# Patient Record
Sex: Male | Born: 1965 | Race: White | Hispanic: No | Marital: Married | Smoking: Former smoker
Health system: Southern US, Community
[De-identification: ages and names within clinical notes are randomized; demographics above are authoritative.]

## PROBLEM LIST (undated history)

## (undated) HISTORY — PX: CHOLECYSTECTOMY: SHX55

## (undated) HISTORY — PX: SHOULDER SURGERY: SHX246

---

## 2001-03-09 ENCOUNTER — Emergency Department (HOSPITAL_COMMUNITY): Admission: EM | Admit: 2001-03-09 | Discharge: 2001-03-09 | Payer: Self-pay

## 2001-11-16 ENCOUNTER — Emergency Department (HOSPITAL_COMMUNITY): Admission: EM | Admit: 2001-11-16 | Discharge: 2001-11-16 | Payer: Self-pay | Admitting: Emergency Medicine

## 2006-06-17 ENCOUNTER — Emergency Department (HOSPITAL_COMMUNITY): Admission: EM | Admit: 2006-06-17 | Discharge: 2006-06-17 | Payer: Self-pay | Admitting: Emergency Medicine

## 2007-03-27 ENCOUNTER — Emergency Department (HOSPITAL_COMMUNITY): Admission: EM | Admit: 2007-03-27 | Discharge: 2007-03-27 | Payer: Self-pay | Admitting: Emergency Medicine

## 2008-05-17 ENCOUNTER — Emergency Department (HOSPITAL_COMMUNITY): Admission: EM | Admit: 2008-05-17 | Discharge: 2008-05-17 | Payer: Self-pay | Admitting: Emergency Medicine

## 2009-11-10 ENCOUNTER — Inpatient Hospital Stay (HOSPITAL_COMMUNITY): Admission: EM | Admit: 2009-11-10 | Discharge: 2009-11-12 | Payer: Self-pay | Admitting: Emergency Medicine

## 2009-11-10 ENCOUNTER — Encounter (INDEPENDENT_AMBULATORY_CARE_PROVIDER_SITE_OTHER): Payer: Self-pay

## 2011-01-03 LAB — DIFFERENTIAL
Basophils Absolute: 0 10*3/uL (ref 0.0–0.1)
Basophils Relative: 0 % (ref 0–1)
Lymphocytes Relative: 16 % (ref 12–46)
Monocytes Absolute: 0.8 10*3/uL (ref 0.1–1.0)
Monocytes Relative: 5 % (ref 3–12)
Neutro Abs: 13.3 10*3/uL — ABNORMAL HIGH (ref 1.7–7.7)

## 2011-01-03 LAB — BASIC METABOLIC PANEL
BUN: 7 mg/dL (ref 6–23)
BUN: 8 mg/dL (ref 6–23)
CO2: 30 mEq/L (ref 19–32)
Calcium: 8.6 mg/dL (ref 8.4–10.5)
Chloride: 109 mEq/L (ref 96–112)
Creatinine, Ser: 1.13 mg/dL (ref 0.4–1.5)
GFR calc Af Amer: >60 mL/min (ref >60)
GFR calc Af Amer: >60 mL/min (ref >60)
GFR calc non Af Amer: >60 mL/min (ref >60)
Glucose, Bld: 100 mg/dL — ABNORMAL HIGH (ref 70–99)
Glucose, Bld: 110 mg/dL — ABNORMAL HIGH (ref 70–99)
Potassium: 3.8 mEq/L (ref 3.5–5.1)
Potassium: 3.8 mEq/L (ref 3.5–5.1)
Sodium: 140 mEq/L (ref 135–145)
Sodium: 144 mEq/L (ref 135–145)

## 2011-01-03 LAB — COMPREHENSIVE METABOLIC PANEL
AST: 41 U/L — ABNORMAL HIGH (ref 0–37)
Albumin: 4.3 g/dL (ref 3.5–5.2)
BUN: 13 mg/dL (ref 6–23)
Calcium: 9.1 mg/dL (ref 8.4–10.5)
Creatinine, Ser: 1.09 mg/dL (ref 0.4–1.5)
Total Bilirubin: 0.5 mg/dL (ref 0.3–1.2)
Total Protein: 7.1 g/dL (ref 6.0–8.3)

## 2011-01-03 LAB — CBC
HCT: 36.9 % — ABNORMAL LOW (ref 39.0–52.0)
HCT: 38.5 % — ABNORMAL LOW (ref 39.0–52.0)
Hemoglobin: 12.7 g/dL — ABNORMAL LOW (ref 13.0–17.0)
MCHC: 33.9 g/dL (ref 30.0–36.0)
MCHC: 34.1 g/dL (ref 30.0–36.0)
MCHC: 34.4 g/dL (ref 30.0–36.0)
MCHC: 34.9 g/dL (ref 30.0–36.0)
MCV: 92.2 fL (ref 78.0–100.0)
MCV: 92.3 fL (ref 78.0–100.0)
MCV: 92.9 fL (ref 78.0–100.0)
Platelets: 221 10*3/uL (ref 150–400)
Platelets: 250 10*3/uL (ref 150–400)
RBC: 3.97 MIL/uL — ABNORMAL LOW (ref 4.22–5.81)
RBC: 4.17 MIL/uL — ABNORMAL LOW (ref 4.22–5.81)
RBC: 4.25 MIL/uL (ref 4.22–5.81)
RBC: 4.67 MIL/uL (ref 4.22–5.81)
RDW: 13.1 % (ref 11.5–15.5)
WBC: 13.6 10*3/uL — ABNORMAL HIGH (ref 4.0–10.5)
WBC: 14 10*3/uL — ABNORMAL HIGH (ref 4.0–10.5)
WBC: 8.8 10*3/uL (ref 4.0–10.5)

## 2011-01-03 LAB — URINALYSIS, ROUTINE W REFLEX MICROSCOPIC
Glucose, UA: NEGATIVE mg/dL
Nitrite: NEGATIVE
Protein, ur: NEGATIVE mg/dL
pH: 6.5 (ref 5.0–8.0)

## 2011-01-03 LAB — GLUCOSE, CAPILLARY: Glucose-Capillary: 115 mg/dL — ABNORMAL HIGH (ref 70–99)

## 2011-01-03 LAB — URINE MICROSCOPIC-ADD ON

## 2011-01-03 LAB — LIPASE, BLOOD: Lipase: 26 U/L (ref 11–59)

## 2011-08-05 LAB — I-STAT 8, (EC8 V) (CONVERTED LAB)
Acid-Base Excess: 1
Chloride: 106
Glucose, Bld: 93
Potassium: 3.7
pCO2, Ven: 43.7 — ABNORMAL LOW
pH, Ven: 7.384 — ABNORMAL HIGH

## 2011-08-05 LAB — POCT I-STAT CREATININE: Creatinine, Ser: 1

## 2012-03-04 ENCOUNTER — Emergency Department (HOSPITAL_COMMUNITY)
Admission: EM | Admit: 2012-03-04 | Discharge: 2012-03-04 | Disposition: A | Discharge status: Home / Self Care | Payer: BC Managed Care – PPO | Attending: Emergency Medicine | Admitting: Emergency Medicine

## 2012-03-04 ENCOUNTER — Encounter (HOSPITAL_COMMUNITY): Payer: Self-pay

## 2012-03-04 DIAGNOSIS — IMO0002 Reserved for concepts with insufficient information to code with codable children: Secondary | ICD-10-CM | POA: Insufficient documentation

## 2012-03-04 DIAGNOSIS — M545 Low back pain, unspecified: Secondary | ICD-10-CM | POA: Insufficient documentation

## 2012-03-04 DIAGNOSIS — R209 Unspecified disturbances of skin sensation: Secondary | ICD-10-CM | POA: Insufficient documentation

## 2012-03-04 DIAGNOSIS — M5416 Radiculopathy, lumbar region: Secondary | ICD-10-CM

## 2012-03-04 DIAGNOSIS — X500XXA Overexertion from strenuous movement or load, initial encounter: Secondary | ICD-10-CM | POA: Insufficient documentation

## 2012-03-04 MED ORDER — HYDROMORPHONE HCL PF 1 MG/ML IJ SOLN
1.0000 mg | Freq: Once | INTRAMUSCULAR | Status: AC
Start: 2012-03-04 — End: 2012-03-04
  Administered 2012-03-04: 1 mg via INTRAVENOUS
  Filled 2012-03-04: qty 1

## 2012-03-04 MED ORDER — HYDROCODONE-ACETAMINOPHEN 5-325 MG PO TABS
1.0000 | ORAL_TABLET | Freq: Once | ORAL | Status: AC
  Administered 2012-03-04: 1 via ORAL
  Filled 2012-03-04: qty 1

## 2012-03-04 MED ORDER — HYDROCODONE-ACETAMINOPHEN 5-325 MG PO TABS: 2.0000 | ORAL_TABLET | ORAL | Status: AC | PRN

## 2012-03-04 NOTE — ED Notes (Signed)
ZOX:[id number]<AV> Expected date:<BR> Expected time: 1:58 PM<BR> Means of arrival:Ambulance<BR> Comments:<BR> M10 -- Back Pain

## 2012-03-04 NOTE — ED Notes (Signed)
Per EMS pt completed moving a rental carpet cleaner and felt sudden pain in lower back with numbness and tingling. Pain radiates abd to chest.

## 2012-03-04 NOTE — ED Notes (Signed)
Pt able to tolerate standing. WC  To car.

## 2012-03-04 NOTE — ED Notes (Signed)
Fentanyl given in route
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
## 2012-03-04 NOTE — Discharge Instructions (Signed)
Lumbosacral Radiculopathy Lumbosacral radiculopathy is a pinched nerve or nerves in the low back (lumbosacral area). When this happens you may have weakness in your legs and may not be able to stand on your toes. You may have pain going down into your legs. There may be difficulties with walking normally. There are many causes of this problem. Sometimes this may happen from an injury, or simply from arthritis or boney problems. It may also be caused by other illnesses such as diabetes. If there is no improvement after treatment, further studies may be done to find the exact cause. DIAGNOSIS  X-rays may be needed if the problems become long standing. Electromyograms may be done. This study is one in which the working of nerves and muscles is studied. HOME CARE INSTRUCTIONS   Applications of ice packs may be helpful. Ice can be used in a plastic bag with a towel around it to prevent frostbite to skin. This may be used every 2 hours for 20 to 30 minutes, or as needed, while awake, or as directed by your caregiver.   Only take over-the-counter or prescription medicines for pain, discomfort, or fever as directed by your caregiver.   If physical therapy was prescribed, follow your caregiver's directions.  SEEK IMMEDIATE MEDICAL CARE IF:   You have pain not controlled with medications.   You seem to be getting worse rather than better.   You develop increasing weakness in your legs.   You develop loss of bowel or bladder control.   You have difficulty with walking or balance, or develop clumsiness in the use of your legs.   You have a fever.  MAKE SURE YOU:   Understand these instructions.   Will watch your condition.   Will get help right away if you are not doing well or get worse.  Document Released: 10/04/2005 Document Revised: 09/23/2011 Document Reviewed: 05/24/2008 [facility] Patient Information 2012 [place], [place].   Take ibuprofen or Tylenol for mild pain or the pain medicine  prescribed for bad pain. Contact Dr. Vear Clock if having significant pain in 4 or 5 days

## 2012-03-07 ENCOUNTER — Other Ambulatory Visit (HOSPITAL_COMMUNITY): Payer: Self-pay

## 2012-03-07 DIAGNOSIS — M79604 Pain in right leg: Secondary | ICD-10-CM

## 2012-03-07 DIAGNOSIS — M545 Low back pain, unspecified: Secondary | ICD-10-CM

## 2012-03-10 ENCOUNTER — Ambulatory Visit (HOSPITAL_COMMUNITY)
Admission: RE | Admit: 2012-03-10 | Discharge: 2012-03-10 | Disposition: A | Discharge status: Home / Self Care | Payer: BC Managed Care – PPO | Source: Ambulatory Visit

## 2012-03-10 DIAGNOSIS — M545 Low back pain, unspecified: Secondary | ICD-10-CM

## 2012-03-10 DIAGNOSIS — M5126 Other intervertebral disc displacement, lumbar region: Secondary | ICD-10-CM | POA: Insufficient documentation

## 2012-03-10 DIAGNOSIS — M79604 Pain in right leg: Secondary | ICD-10-CM

## 2012-05-08 ENCOUNTER — Encounter: Payer: Self-pay | Admitting: Neurological Surgery

## 2012-05-18 ENCOUNTER — Encounter: Payer: Self-pay | Admitting: Neurological Surgery

## 2012-06-18 ENCOUNTER — Encounter: Payer: Self-pay | Admitting: Neurological Surgery

## 2013-01-09 ENCOUNTER — Emergency Department: Payer: Self-pay | Admitting: Emergency Medicine

## 2015-04-29 ENCOUNTER — Encounter: Payer: Self-pay | Admitting: Urgent Care

## 2015-04-29 ENCOUNTER — Emergency Department: Payer: BLUE CROSS/BLUE SHIELD

## 2015-04-29 ENCOUNTER — Emergency Department
Admission: EM | Admit: 2015-04-29 | Discharge: 2015-04-29 | Disposition: A | Discharge status: Home / Self Care | Payer: BLUE CROSS/BLUE SHIELD | Attending: Emergency Medicine | Admitting: Emergency Medicine

## 2015-04-29 DIAGNOSIS — R1032 Left lower quadrant pain: Secondary | ICD-10-CM | POA: Diagnosis present

## 2015-04-29 DIAGNOSIS — Z79899 Other long term (current) drug therapy: Secondary | ICD-10-CM | POA: Diagnosis not present

## 2015-04-29 DIAGNOSIS — R197 Diarrhea, unspecified: Secondary | ICD-10-CM | POA: Diagnosis not present

## 2015-04-29 DIAGNOSIS — Z87891 Personal history of nicotine dependence: Secondary | ICD-10-CM | POA: Diagnosis not present

## 2015-04-29 DIAGNOSIS — N281 Cyst of kidney, acquired: Secondary | ICD-10-CM | POA: Diagnosis not present

## 2015-04-29 LAB — URINALYSIS COMPLETE WITH MICROSCOPIC (ARMC ONLY)
BACTERIA UA: NONE SEEN
BILIRUBIN URINE: NEGATIVE
Glucose, UA: NEGATIVE mg/dL
Hgb urine dipstick: NEGATIVE
Ketones, ur: NEGATIVE mg/dL
LEUKOCYTES UA: NEGATIVE
Nitrite: NEGATIVE
PH: 5 (ref 5.0–8.0)
Protein, ur: NEGATIVE mg/dL
SQUAMOUS EPITHELIAL / LPF: NONE SEEN
Specific Gravity, Urine: 1.026 (ref 1.005–1.030)

## 2015-04-29 LAB — COMPREHENSIVE METABOLIC PANEL
ALK PHOS: 64 U/L (ref 38–126)
ALT: 44 U/L (ref 17–63)
AST: 35 U/L (ref 15–41)
Albumin: 4.2 g/dL (ref 3.5–5.0)
Anion gap: 6 (ref 5–15)
BUN: 14 mg/dL (ref 6–20)
CO2: 26 mmol/L (ref 22–32)
CREATININE: 1.11 mg/dL (ref 0.61–1.24)
Calcium: 9.1 mg/dL (ref 8.9–10.3)
Chloride: 109 mmol/L (ref 101–111)
GFR calc Af Amer: >60 mL/min (ref >60)
GFR calc non Af Amer: >60 mL/min (ref >60)
Glucose, Bld: 122 mg/dL — ABNORMAL HIGH (ref 65–99)
Potassium: 3.8 mmol/L (ref 3.5–5.1)
Sodium: 141 mmol/L (ref 135–145)
TOTAL PROTEIN: 6.8 g/dL (ref 6.5–8.1)
Total Bilirubin: 0.5 mg/dL (ref 0.3–1.2)

## 2015-04-29 LAB — CBC
HEMATOCRIT: 44.1 % (ref 40.0–52.0)
Hemoglobin: 14.6 g/dL (ref 13.0–18.0)
MCH: 30.1 pg (ref 26.0–34.0)
MCHC: 33.1 g/dL (ref 32.0–36.0)
MCV: 91 fL (ref 80.0–100.0)
PLATELETS: 266 10*3/uL (ref 150–440)
RBC: 4.84 MIL/uL (ref 4.40–5.90)
RDW: 13.1 % (ref 11.5–14.5)
WBC: 9.4 10*3/uL (ref 3.8–10.6)

## 2015-04-29 LAB — LIPASE, BLOOD: Lipase: 56 U/L — ABNORMAL HIGH (ref 22–51)

## 2015-04-29 MED ORDER — ONDANSETRON HCL 4 MG/2ML IJ SOLN
4.0000 mg | INTRAMUSCULAR | Status: AC
  Administered 2015-04-29: 4 mg via INTRAVENOUS
  Filled 2015-04-29: qty 2

## 2015-04-29 MED ORDER — IOHEXOL 240 MG/ML SOLN
25.0000 mL | Freq: Once | INTRAMUSCULAR | Status: AC | PRN
  Administered 2015-04-29: 25 mL via ORAL

## 2015-04-29 MED ORDER — CIPROFLOXACIN HCL 500 MG PO TABS: 500.0000 mg | ORAL_TABLET | Freq: Two times a day (BID) | ORAL | Status: AC

## 2015-04-29 MED ORDER — MORPHINE SULFATE 4 MG/ML IJ SOLN
4.0000 mg | Freq: Once | INTRAMUSCULAR | Status: AC
  Administered 2015-04-29: 4 mg via INTRAVENOUS
  Filled 2015-04-29: qty 1

## 2015-04-29 MED ORDER — SODIUM CHLORIDE 0.9 % IV BOLUS (SEPSIS)
500.0000 mL | INTRAVENOUS | Status: AC
Start: 2015-04-29 — End: 2015-04-29
  Administered 2015-04-29: 500 mL via INTRAVENOUS

## 2015-04-29 MED ORDER — IOHEXOL 350 MG/ML SOLN
100.0000 mL | Freq: Once | INTRAVENOUS | Status: AC | PRN
  Administered 2015-04-29: 100 mL via INTRAVENOUS

## 2015-04-29 MED ORDER — METRONIDAZOLE 500 MG PO TABS: 500.0000 mg | ORAL_TABLET | Freq: Three times a day (TID) | ORAL | Status: DC

## 2015-04-29 NOTE — ED Notes (Signed)
Patient transported to CT 

## 2015-04-29 NOTE — ED Notes (Signed)
Pt with LLQ since Sunday.  States diarrhea yesterday morning 4 times.  States pain intermittently radiates to RLQ.  Reports passing gas.  Denies n/v.  Pt is A&Ox4, speaking in complete and coherent sentences and in NAD at this time.

## 2015-04-29 NOTE — ED Notes (Signed)
Pt oob to br. 

## 2015-04-29 NOTE — ED Provider Notes (Signed)
CT scan was reviewed by me.   IMPRESSION: Inflammation surrounds the mid descending colon. Appearance raises possibility of epiploic appendagitis. Small focal area of diverticulitis not entirely excluded in the proper clinical setting. Otherwise, no evidence of extra luminal bowel inflammatory process, free fluid or free air.  Fatty infiltration of the liver.  Within the left kidney there are low-density structures some of which are cysts whereas others cannot be confirmed as simple cysts. Largest located lower pole region measures up to 3.2 cm and may have a small calcification along its periphery. Renal sonogram can be obtained for further delineation.  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 No aneurysmal dilation or high-grade stenosis although there are areas of narrowing.   Given the above findings, patient be discharged with Cipro and Flagyl. He'll be advised to follow-up with his doctor. His also advised of the renal cyst and need follow-up for renal ultrasound.  Dalton FilbertJonathan E Raiana Pharris, MD 04/29/15 [phone]
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
## 2015-04-29 NOTE — ED Notes (Signed)
Patient presents with c/o LLQ abd pain since Sunday. (+) diarrhea. Denies N/V/F and urinary symptoms.

## 2015-04-29 NOTE — ED Notes (Signed)
Pt finished with contrast - ct notified

## 2015-04-29 NOTE — Discharge Instructions (Signed)

## 2016-06-09 ENCOUNTER — Ambulatory Visit: Payer: Self-pay | Admitting: Physician Assistant

## 2016-06-09 ENCOUNTER — Encounter: Payer: Self-pay | Admitting: Physician Assistant

## 2016-06-09 VITALS — BP 130/80 | HR 80 | Temp 98.6°F

## 2016-06-09 DIAGNOSIS — J340 Abscess, furuncle and carbuncle of nose: Secondary | ICD-10-CM

## 2016-06-09 MED ORDER — CLINDAMYCIN HCL 300 MG PO CAPS: 300.0000 mg | ORAL_CAPSULE | Freq: Three times a day (TID) | ORAL | 0 refills | Status: DC

## 2016-06-09 MED ORDER — MUPIROCIN 2 % EX OINT: 1.0000 "application " | TOPICAL_OINTMENT | Freq: Two times a day (BID) | CUTANEOUS | 1 refills | Status: DC

## 2016-06-09 NOTE — Progress Notes (Signed)
S: c/o sore on side of nose, had same thing on inside of nose 2 weeks ago, got a little better but now has progressed, states lesion on outside of his nose had a pimple on it that popped during sleep, denies fever/chills  O: vitals wnl, nad, perrl eomi, nose with pea sized lesion on r nostril, area inside of nares is swollen and red, no exudate noted, neck supple no lymph, lungs c t a, cv rrr  A: abscess  P: clindamycin, bactroban ointment

## 2016-12-21 IMAGING — CT CT ABD-PELV W/ CM
1 of 3 series · 13 of 32 positions shown, 18 images · IV contrast (omnipaque)
Comparison: No comparison CT abdomen and pelvis. Prior abdominal
ultrasound 11/10/2009.

CLINICAL DATA: 48-year-old male with 2 day history of gradual onset
of left lower quadrant abdominal pain with multiple episodes of
diarrhea. Post cholecystectomy. Initial encounter.

EXAM:
CT ABDOMEN AND PELVIS WITH CONTRAST
TECHNIQUE: Multidetector CT imaging of the abdomen and pelvis was performed
using the standard protocol following bolus administration of
intravenous contrast.
CONTRAST:  100mL OMNIPAQUE IOHEXOL 350 MG/ML SOLN

[Series 2: routine abd pel with · axial · 0.68mm/px · z∈[-1020,-590]mm · 13 of 98 slices shown, 18 images]
[im 6/98  soft-tissue]
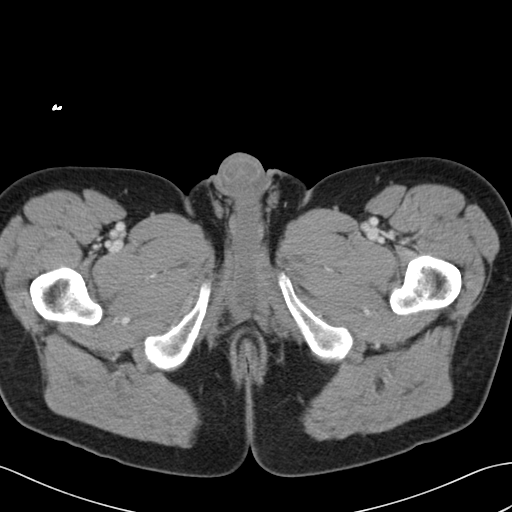
[im 6/98  bone]
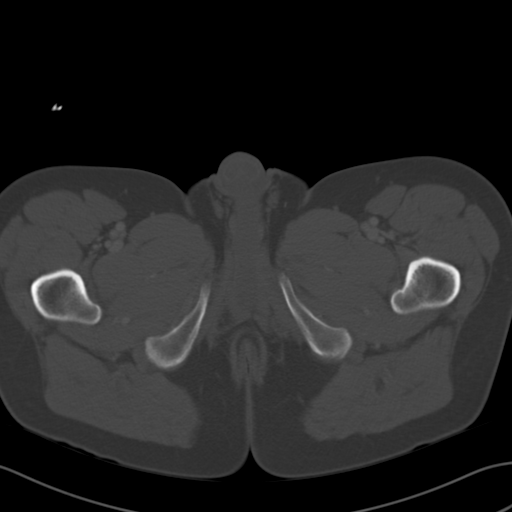
[im 16/98  soft-tissue]
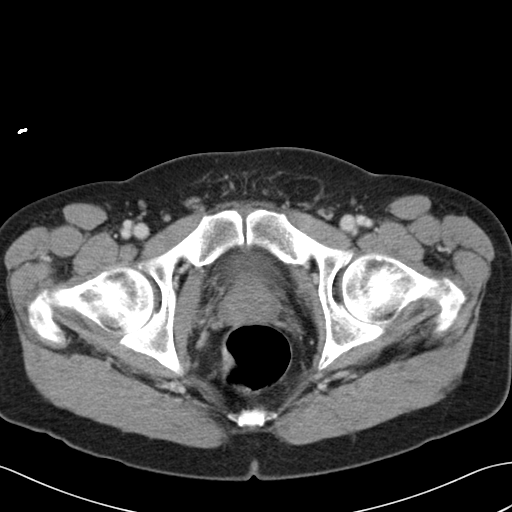
[im 21/98  soft-tissue]
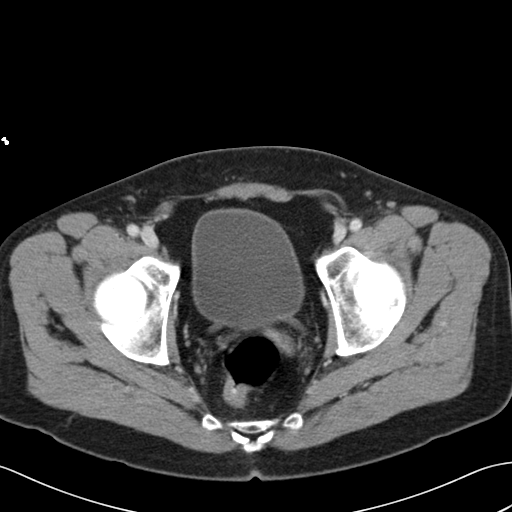
[im 31/98  soft-tissue]
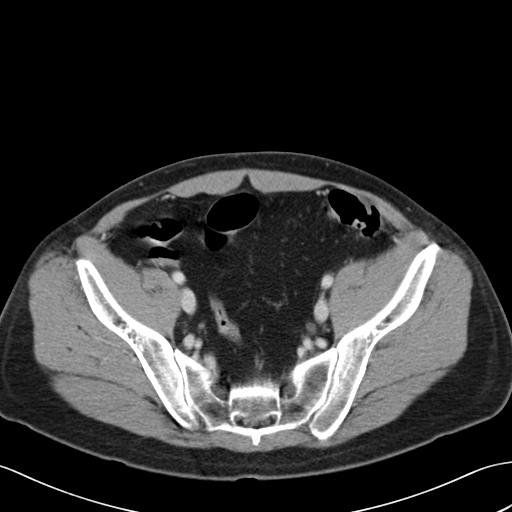
[im 36/98  soft-tissue]
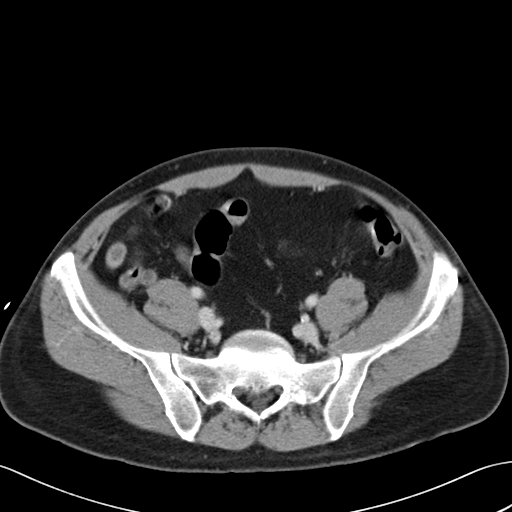
[im 46/98  soft-tissue]
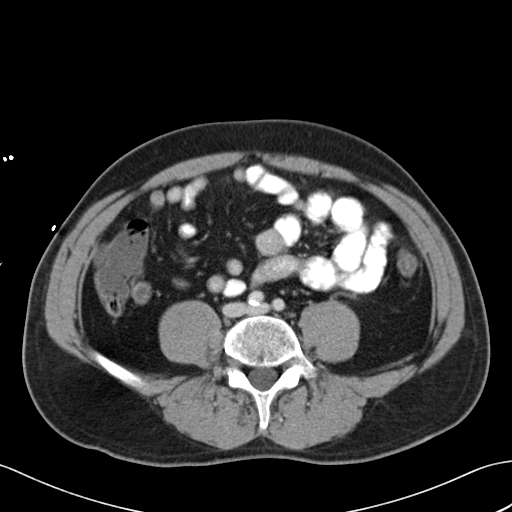
[im 52/98  soft-tissue]
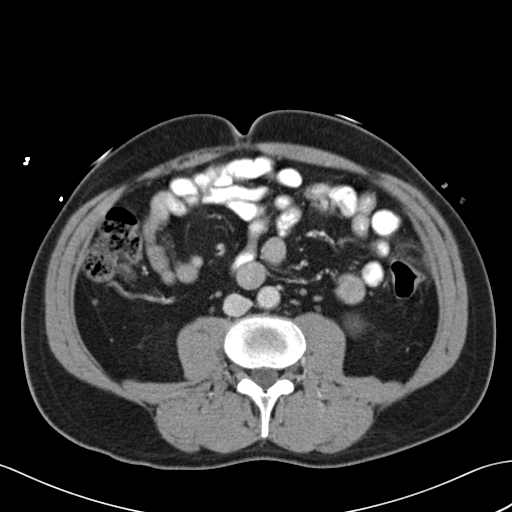
[im 62/98  soft-tissue]
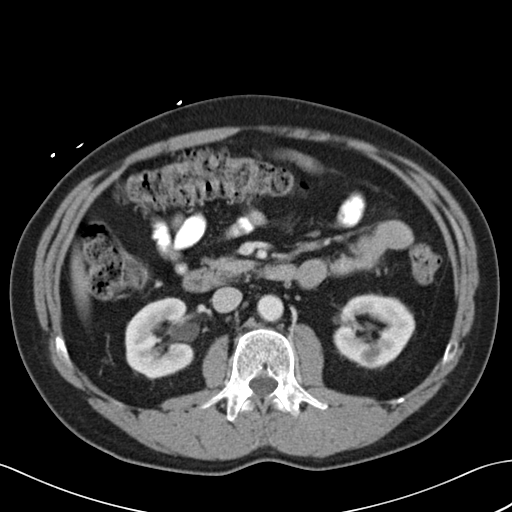
[im 67/98  soft-tissue]
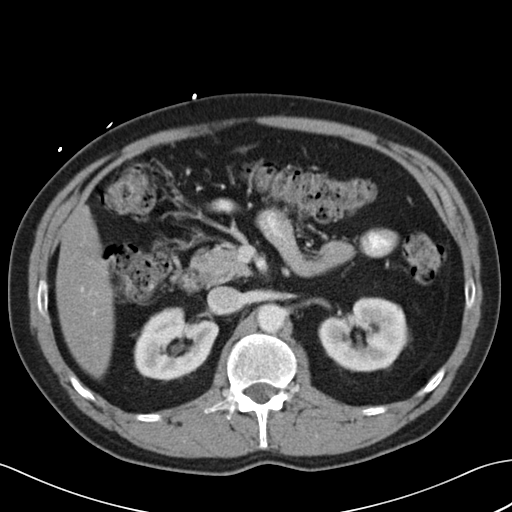
[im 67/98  bone]
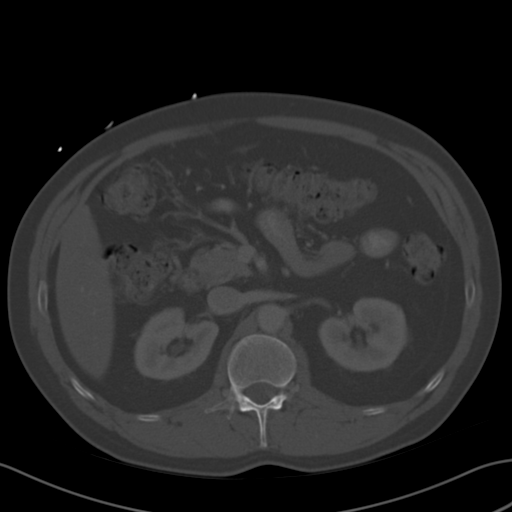
[im 77/98  soft-tissue]
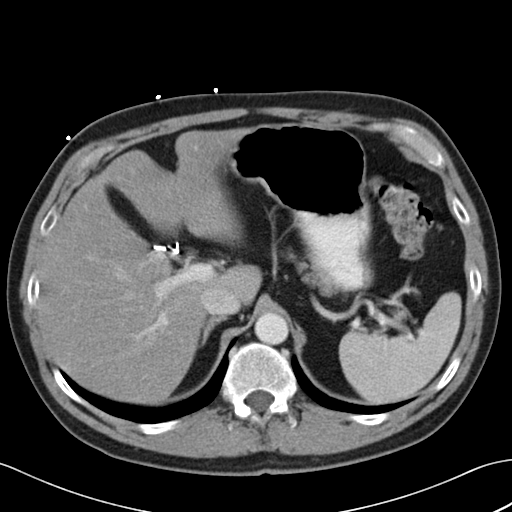
[im 77/98  lung]
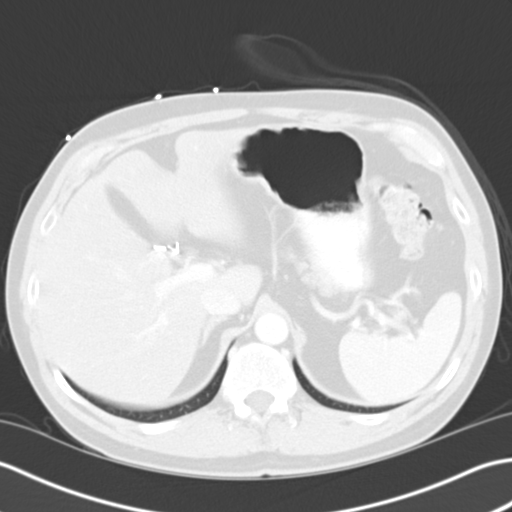
[im 82/98  soft-tissue]
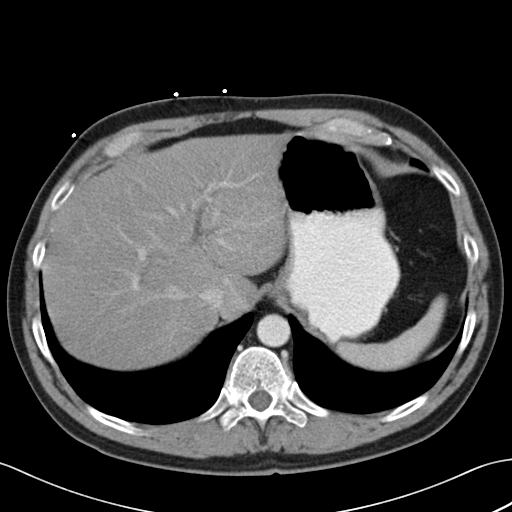
[im 82/98  lung]
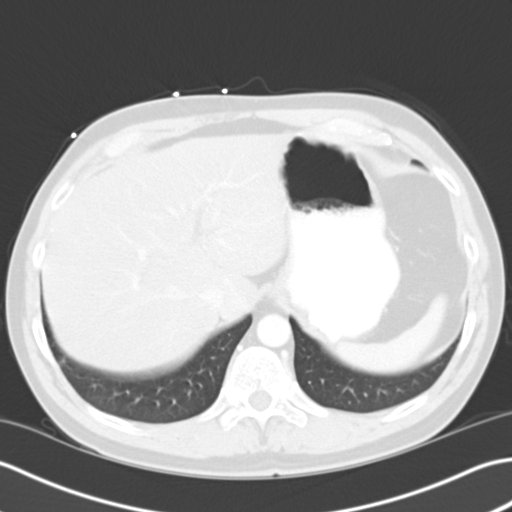
[im 87/98  lung]
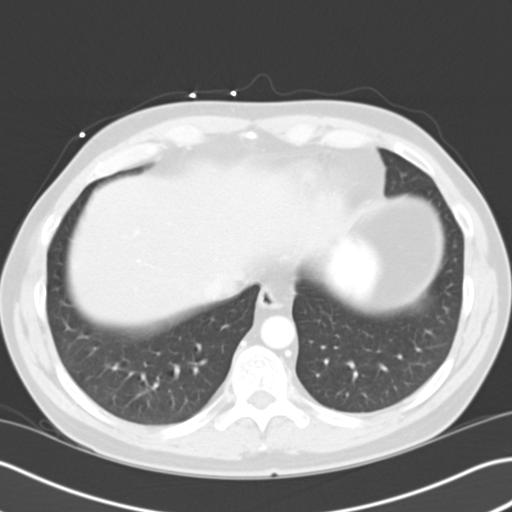
[im 92/98  soft-tissue]
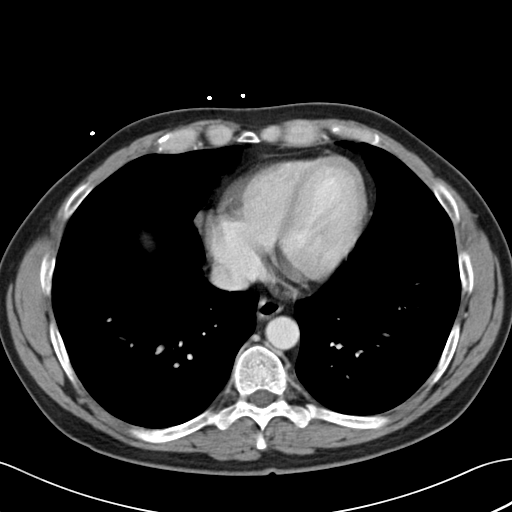
[im 92/98  lung]
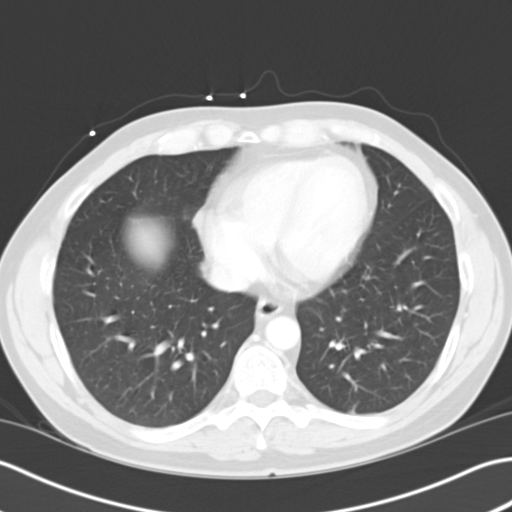

[13 of 32 positions shown; findings below may reference images not displayed]

FINDINGS: Lung bases are clear.  Heart size within normal limits.

Inflammation surrounds the mid descending colon. Appearance raises
possibility of epiploic appendagitis. Small focal area of
diverticulitis not entirely excluded in the proper clinical setting.
Otherwise, no evidence of extra luminal bowel inflammatory process,
free fluid or free air..

Fatty infiltration of the liver. No focal worrisome hepatic lesion.
Post cholecystectomy. No worrisome splenic, pancreatic, adrenal or
right renal lesion.

Within the left kidney there are low-density structures some of
which are cysts whereas others cannot be confirmed as simple cysts.
Largest located lower pole region measures up to 3.2 cm and may have
a small calcification along its periphery. Renal sonogram can be
obtained for further delineation.

Age advanced atherosclerotic type changes abdominal aorta, iliac
arteries and left femoral artery. No aneurysmal dilation or
high-grade stenosis although there are areas of narrowing

Left inguinal fatty containing hernia.

Noncontrast filled views of the urinary bladder unremarkable.
Symmetric appearance of the prostate.

No worrisome osseous lesion.
IMPRESSION: Inflammation surrounds the mid descending colon. Appearance raises
possibility of epiploic appendagitis. Small focal area of
diverticulitis not entirely excluded in the proper clinical setting.
Otherwise, no evidence of extra luminal bowel inflammatory process,
free fluid or free air.

Fatty infiltration of the liver.

Within the left kidney there are low-density structures some of
which are cysts whereas others cannot be confirmed as simple cysts.
Largest located lower pole region measures up to 3.2 cm and may have
a small calcification along its periphery. Renal sonogram can be
obtained for further delineation.

Age advanced atherosclerotic type changes abdominal aorta, iliac
arteries and left femoral artery. No aneurysmal dilation or
high-grade stenosis although there are areas of narrowing.

## 2017-05-19 DIAGNOSIS — H524 Presbyopia: Secondary | ICD-10-CM | POA: Diagnosis not present

## 2017-07-20 DIAGNOSIS — T148XXA Other injury of unspecified body region, initial encounter: Secondary | ICD-10-CM | POA: Diagnosis not present

## 2017-07-20 DIAGNOSIS — S61411A Laceration without foreign body of right hand, initial encounter: Secondary | ICD-10-CM | POA: Diagnosis not present

## 2019-03-05 ENCOUNTER — Telehealth: Payer: BLUE CROSS/BLUE SHIELD | Admitting: Physician Assistant

## 2019-03-05 ENCOUNTER — Encounter: Payer: Self-pay | Admitting: Physician Assistant

## 2019-03-05 DIAGNOSIS — W57XXXA Bitten or stung by nonvenomous insect and other nonvenomous arthropods, initial encounter: Secondary | ICD-10-CM

## 2019-03-05 DIAGNOSIS — R21 Rash and other nonspecific skin eruption: Secondary | ICD-10-CM | POA: Diagnosis not present

## 2019-03-05 MED ORDER — DOXYCYCLINE HYCLATE 100 MG PO TABS: 100.0000 mg | ORAL_TABLET | Freq: Two times a day (BID) | ORAL | 0 refills | Status: DC

## 2019-03-05 NOTE — Progress Notes (Signed)
Thank you for describing your tick bite, Here is how we plan to help! Based on the information that you shared with me it looks like you have A tick that bite that we will treat with a short course of doxycycline.  In most cases a tick bite is painless and does not itch.  Most tick bites in which the tick is quickly removed do not require prescriptions. Ticks can transmit several diseases if they are infected and remain attacked to your skin. Therefore the length that the tick was attached and any symptoms you have experienced after the bite are import to accurately develop your custom treatment plan. In most cases a single dose of doxycycline may prevent the development of a more serious condition.  Based on your information I have prescribed Doxycyline 100 mg twice daily for 10 days.    Which ticks  are associated with illness?  The Wood Tick (dog tick) is the size of a watermelon seed and can sometimes transmit [facility] spotted fever and [place] tick fever.   The Deer Tick (black-legged tick) is between the size of a poppy seed (pin head) and an apple seed, and can sometimes transmit Lyme disease.  A brown to black tick with a white splotch on its back is likely a male Amblyomma americanum ([place] tick). This tick has been associated with Southern Tick Associated illness ( STARI)  Lyme disease has become the most common tick-borne illness in the [place]. The risk of Lyme disease following a recognized deer tick bite is estimated to be 1%.  The majority of cases of Lyme disease start with a bull's eye rash at the site of the tick bite. The rash can occur days to weeks (typically 7-10 days) after a tick bite. Treatment with antibiotics is indicated if this rash appears. Flu-like symptoms may accompany the rash, including: fever, chills, headaches, muscle aches, and fatigue. Removing ticks promptly may prevent tick borne disease.  What can be used to prevent Tick  Bites?   Insect repellant with at leas 20% DEET.  Wearing long pants with sock and shoes.  Avoiding tall grass and heavily wooded areas.  Checking your skin after being outdoors.  Shower with a washcloth after outdoor exposures.  HOME CARE ADVICE FOR TICK BITE  1. Wood Tick Removal:  o Use a pair of tweezers and grasp the wood tick close to the skin (on its head). Pull the wood tick straight upward without twisting or crushing it. Maintain a steady pressure until it releases its grip.   o If tweezers aren't available, use fingers, a loop of thread around the jaws, or a needle between the jaws for traction.  o Note: covering the tick with petroleum jelly, nail polish or rubbing alcohol doesn't work. Neither does touching the tick with a hot or cold object. 2. Tiny Deer Tick Removal:   o Needs to be scraped off with a knife blade or credit card edge. o Place tick in a sealed container (e.g. glass jar, zip lock plastic bag), in case your doctor wants to see it. 3. Tick's Head Removal:  o If the wood tick's head breaks off in the skin, it must be removed. Clean the skin. Then use a sterile needle to uncover the head and lift it out or scrape it off.  o If a very small piece of the head remains, the skin will eventually slough it off. 4. Antibiotic Ointment:  o Wash the wound and your hands  with soap and water after removal to prevent catching any tick disease.  Apply an over the counter antibiotic ointment (e.g. bacitracin) to the bite once. 5. Expected Course: Tick bites normally don't itch or hurt. That's why they often go unnoticed. 6. Call Your Doctor If:  o You can't remove the tick or the tick's head o Fever, a severe head ache, or rash occur in the next 2 weeks o Bite begins to look infected o Lyme's disease is common in your area o You have not had a tetanus in the last 10 years o Your current symptoms become worse    MAKE SURE YOU   Understand these instructions.  Will  watch your condition.  Will get help right away if you are not doing well or get worse.   Thank you for choosing an e-visit.  Your e-visit answers were reviewed by a board certified advanced clinical practitioner to complete your personal care plan. Depending upon the condition, your plan could have included both over the counter or prescription medications. Please review your pharmacy choice. If there is a problem you may use MyChart messaging to have the prescription routed to another pharmacy. Your safety is important to [place]. If you have drug allergies check your prescription carefully.   You can use MyChart to ask questions about today's visit, request a non-urgent call back, or ask for a work or school excuse for 24 hours related to this e-Visit. If it has been greater than 24 hours you will need to follow up with your provider, or enter a new e-Visit to address those concerns.  You will get an email in the next two days asking about your experience. I hope  that your e-visit has been valuable and will speed your recovery I have spent 7 min in completion and review of this note- Illa Level [facility]

## 2019-10-08 ENCOUNTER — Other Ambulatory Visit
Admission: RE | Admit: 2019-10-08 | Discharge: 2019-10-08 | Disposition: A | Discharge status: Home / Self Care | Payer: 59 | Source: Ambulatory Visit | Attending: Internal Medicine | Admitting: Internal Medicine

## 2019-10-08 DIAGNOSIS — Z6824 Body mass index (BMI) 24.0-24.9, adult: Secondary | ICD-10-CM | POA: Diagnosis not present

## 2019-10-08 DIAGNOSIS — R5383 Other fatigue: Secondary | ICD-10-CM | POA: Diagnosis not present

## 2019-10-08 DIAGNOSIS — Z0000 Encounter for general adult medical examination without abnormal findings: Secondary | ICD-10-CM | POA: Diagnosis not present

## 2019-10-08 DIAGNOSIS — Z1331 Encounter for screening for depression: Secondary | ICD-10-CM | POA: Diagnosis not present

## 2019-10-08 DIAGNOSIS — Z299 Encounter for prophylactic measures, unspecified: Secondary | ICD-10-CM | POA: Diagnosis not present

## 2019-10-08 DIAGNOSIS — Z1211 Encounter for screening for malignant neoplasm of colon: Secondary | ICD-10-CM | POA: Diagnosis not present

## 2019-10-08 LAB — COMPREHENSIVE METABOLIC PANEL
ALT: 25 U/L (ref 0–44)
AST: 29 U/L (ref 15–41)
Albumin: 4.3 g/dL (ref 3.5–5.0)
Alkaline Phosphatase: 57 U/L (ref 38–126)
Anion gap: 7 (ref 5–15)
BUN: 13 mg/dL (ref 6–20)
CO2: 28 mmol/L (ref 22–32)
Calcium: 9.3 mg/dL (ref 8.9–10.3)
Chloride: 104 mmol/L (ref 98–111)
Creatinine, Ser: 0.96 mg/dL (ref 0.61–1.24)
GFR calc Af Amer: >60 mL/min (ref >60)
GFR calc non Af Amer: >60 mL/min (ref >60)
Glucose, Bld: 105 mg/dL — ABNORMAL HIGH (ref 70–99)
Potassium: 4.4 mmol/L (ref 3.5–5.1)
Sodium: 139 mmol/L (ref 135–145)
Total Bilirubin: 1 mg/dL (ref 0.3–1.2)
Total Protein: 6.9 g/dL (ref 6.5–8.1)

## 2019-10-08 LAB — CBC
HCT: 41.9 % (ref 39.0–52.0)
Hemoglobin: 14.7 g/dL (ref 13.0–17.0)
MCH: 29.9 pg (ref 26.0–34.0)
MCHC: 35.1 g/dL (ref 30.0–36.0)
MCV: 85.2 fL (ref 80.0–100.0)
Platelets: 283 10*3/uL (ref 150–400)
RBC: 4.92 MIL/uL (ref 4.22–5.81)
RDW: 12.5 % (ref 11.5–15.5)
WBC: 7.9 10*3/uL (ref 4.0–10.5)
nRBC: 0 % (ref 0.0–0.2)

## 2019-10-08 LAB — LIPID PANEL
Cholesterol: 178 mg/dL (ref 0–200)
HDL: 45 mg/dL (ref >40)
LDL Cholesterol: 115 mg/dL — ABNORMAL HIGH (ref 0–99)
Total CHOL/HDL Ratio: 4 RATIO
Triglycerides: 90 mg/dL (ref <150)
VLDL: 18 mg/dL (ref 0–40)

## 2019-10-08 LAB — TSH: TSH: 1.779 u[IU]/mL (ref 0.350–4.500)

## 2019-10-08 LAB — PSA: Prostatic Specific Antigen: 2.1 ng/mL (ref 0.00–4.00)

## 2019-10-23 ENCOUNTER — Encounter: Payer: Self-pay | Admitting: Internal Medicine

## 2019-10-25 ENCOUNTER — Telehealth: Payer: Self-pay

## 2019-10-25 ENCOUNTER — Other Ambulatory Visit: Payer: Self-pay

## 2019-10-25 DIAGNOSIS — H524 Presbyopia: Secondary | ICD-10-CM | POA: Diagnosis not present

## 2019-10-25 DIAGNOSIS — R195 Other fecal abnormalities: Secondary | ICD-10-CM

## 2019-10-25 MED ORDER — NA SULFATE-K SULFATE-MG SULF 17.5-3.13-1.6 GM/177ML PO SOLN: 354.0000 mL | Freq: Once | ORAL | 0 refills | Status: AC

## 2019-10-25 NOTE — Telephone Encounter (Signed)
Gastroenterology Pre-Procedure Review    PATIENT REVIEW QUESTIONS: The patient responded to the following health history questions as indicated:    1. Are you having any GI issues? no 2. Do you have a personal history of Polyps? no 3. Do you have a family history of Colon Cancer or Polyps? no 4. Diabetes Mellitus? no 5. Joint replacements in the past 12 months?no 6. Major health problems in the past 3 months?no 7. Any artificial heart valves, MVP, or defibrillator?no    MEDICATIONS & ALLERGIES:    Patient reports the following regarding taking any anticoagulation/antiplatelet therapy:   Plavix, Coumadin, Eliquis, Xarelto, Lovenox, Pradaxa, Brilinta, or Effient? no Aspirin? no  Patient confirms/reports the following medications:  Current Outpatient Medications  Medication Sig Dispense Refill  . clindamycin (CLEOCIN) 300 MG capsule Take 1 capsule (300 mg total) by mouth 3 (three) times daily. 21 capsule 0  . dextromethorphan-guaiFENesin (MUCINEX DM) 30-600 MG per 12 hr tablet Take 1 tablet by mouth 2 (two) times daily.    Marland Kitchen doxycycline (VIBRA-TABS) 100 MG tablet Take 1 tablet (100 mg total) by mouth 2 (two) times daily. 20 tablet 0  . fish oil-omega-3 fatty acids 1000 MG capsule Take 2 g by mouth daily.    Marland Kitchen ibuprofen (ADVIL,MOTRIN) 200 MG tablet Take 200 mg by mouth every 6 (six) hours as needed. Pain    . metroNIDAZOLE (FLAGYL) 500 MG tablet Take 1 tablet (500 mg total) by mouth 3 (three) times daily. (Patient not taking: Reported on 06/09/2016) 21 tablet 0  . Multiple Vitamin (MULITIVITAMIN WITH MINERALS) TABS Take 1 tablet by mouth daily.    . mupirocin ointment (BACTROBAN) 2 % Place 1 application into the nose 2 (two) times daily. Also apply to lesion on the nose bid 22 g 1   No current facility-administered medications for this visit.    Patient confirms/reports the following allergies:  Allergies  Allergen Reactions  . Sulfa Antibiotics Hives    No orders of the defined  types were placed in this encounter.   AUTHORIZATION INFORMATION Primary Insurance: 1D#: Group #:  Secondary Insurance: 1D#: Group #:  SCHEDULE INFORMATION: Date: 11/16/2019 Time: Location:[facility]

## 2019-10-29 ENCOUNTER — Telehealth: Payer: Self-pay

## 2019-10-29 NOTE — Telephone Encounter (Signed)
Patient states he want to have it done at [facility] and wants to wait till they reopen the [facility] Endo unit up. Patient states he can not get off work to do it before

## 2019-11-02 ENCOUNTER — Telehealth: Payer: Self-pay

## 2019-11-02 NOTE — Telephone Encounter (Signed)
Called and left a message for call back to rescheduled colonoscopy.

## 2019-11-06 ENCOUNTER — Telehealth: Payer: Self-pay

## 2019-11-06 NOTE — Telephone Encounter (Signed)
Called and left a message for call back to scheduled colonoscopy. Sent patient a [employer]

## 2019-11-16 ENCOUNTER — Ambulatory Visit: Admit: 2019-11-16 | Payer: 59 | Admitting: Gastroenterology

## 2019-11-16 SURGERY — COLONOSCOPY WITH PROPOFOL
Anesthesia: General

## 2019-12-11 DIAGNOSIS — Z299 Encounter for prophylactic measures, unspecified: Secondary | ICD-10-CM | POA: Diagnosis not present

## 2019-12-11 DIAGNOSIS — Z789 Other specified health status: Secondary | ICD-10-CM | POA: Diagnosis not present

## 2019-12-11 DIAGNOSIS — Z6824 Body mass index (BMI) 24.0-24.9, adult: Secondary | ICD-10-CM | POA: Diagnosis not present

## 2019-12-11 DIAGNOSIS — M549 Dorsalgia, unspecified: Secondary | ICD-10-CM | POA: Diagnosis not present

## 2019-12-11 DIAGNOSIS — Z713 Dietary counseling and surveillance: Secondary | ICD-10-CM | POA: Diagnosis not present

## 2020-10-09 ENCOUNTER — Other Ambulatory Visit
Admission: RE | Admit: 2020-10-09 | Discharge: 2020-10-09 | Disposition: A | Discharge status: Home / Self Care | Payer: 59 | Attending: Internal Medicine | Admitting: Internal Medicine

## 2020-10-09 DIAGNOSIS — R509 Fever, unspecified: Secondary | ICD-10-CM | POA: Diagnosis not present

## 2020-10-09 DIAGNOSIS — Z125 Encounter for screening for malignant neoplasm of prostate: Secondary | ICD-10-CM | POA: Diagnosis not present

## 2020-10-09 DIAGNOSIS — Z299 Encounter for prophylactic measures, unspecified: Secondary | ICD-10-CM | POA: Diagnosis not present

## 2020-10-09 DIAGNOSIS — Z6823 Body mass index (BMI) 23.0-23.9, adult: Secondary | ICD-10-CM | POA: Diagnosis not present

## 2020-10-09 DIAGNOSIS — R5383 Other fatigue: Secondary | ICD-10-CM | POA: Diagnosis not present

## 2020-10-09 DIAGNOSIS — Z0000 Encounter for general adult medical examination without abnormal findings: Secondary | ICD-10-CM | POA: Diagnosis not present

## 2020-10-09 DIAGNOSIS — Z1331 Encounter for screening for depression: Secondary | ICD-10-CM | POA: Diagnosis not present

## 2020-10-09 LAB — COMPREHENSIVE METABOLIC PANEL
ALT: 24 U/L (ref 0–44)
AST: 27 U/L (ref 15–41)
Albumin: 4.4 g/dL (ref 3.5–5.0)
Alkaline Phosphatase: 59 U/L (ref 38–126)
Anion gap: 8 (ref 5–15)
BUN: 13 mg/dL (ref 6–20)
CO2: 27 mmol/L (ref 22–32)
Calcium: 9.3 mg/dL (ref 8.9–10.3)
Chloride: 105 mmol/L (ref 98–111)
Creatinine, Ser: 1 mg/dL (ref 0.61–1.24)
GFR, Estimated: >60 mL/min (ref >60)
Glucose, Bld: 92 mg/dL (ref 70–99)
Potassium: 4 mmol/L (ref 3.5–5.1)
Sodium: 140 mmol/L (ref 135–145)
Total Bilirubin: 1 mg/dL (ref 0.3–1.2)
Total Protein: 7.2 g/dL (ref 6.5–8.1)

## 2020-10-09 LAB — CBC
HCT: 43.5 % (ref 39.0–52.0)
Hemoglobin: 14.7 g/dL (ref 13.0–17.0)
MCH: 30.1 pg (ref 26.0–34.0)
MCHC: 33.8 g/dL (ref 30.0–36.0)
MCV: 89 fL (ref 80.0–100.0)
Platelets: 296 10*3/uL (ref 150–400)
RBC: 4.89 MIL/uL (ref 4.22–5.81)
RDW: 12.6 % (ref 11.5–15.5)
WBC: 9.5 10*3/uL (ref 4.0–10.5)
nRBC: 0 % (ref 0.0–0.2)

## 2020-10-09 LAB — PSA: Prostatic Specific Antigen: 2.94 ng/mL (ref 0.00–4.00)

## 2020-10-09 LAB — LIPID PANEL
Cholesterol: 193 mg/dL (ref 0–200)
HDL: 53 mg/dL (ref >40)
LDL Cholesterol: 124 mg/dL — ABNORMAL HIGH (ref 0–99)
Total CHOL/HDL Ratio: 3.6 RATIO
Triglycerides: 82 mg/dL (ref <150)
VLDL: 16 mg/dL (ref 0–40)

## 2020-10-09 LAB — TSH: TSH: 1.142 u[IU]/mL (ref 0.350–4.500)

## 2021-05-30 ENCOUNTER — Other Ambulatory Visit: Payer: Self-pay

## 2021-05-30 ENCOUNTER — Ambulatory Visit
Admission: EM | Admit: 2021-05-30 | Discharge: 2021-05-30 | Disposition: A | Discharge status: Home / Self Care | Payer: 59 | Attending: Family Medicine | Admitting: Family Medicine

## 2021-05-30 DIAGNOSIS — J40 Bronchitis, not specified as acute or chronic: Secondary | ICD-10-CM

## 2021-05-30 MED ORDER — PROMETHAZINE-DM 6.25-15 MG/5ML PO SYRP: 5.0000 mL | ORAL_SOLUTION | Freq: Four times a day (QID) | ORAL | 0 refills | Status: DC | PRN

## 2021-05-30 MED ORDER — DOXYCYCLINE HYCLATE 100 MG PO CAPS: 100.0000 mg | ORAL_CAPSULE | Freq: Two times a day (BID) | ORAL | 0 refills | Status: DC

## 2021-05-30 MED ORDER — PREDNISONE 50 MG PO TABS: ORAL_TABLET | ORAL | 0 refills | Status: DC
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
## 2021-05-30 NOTE — ED Triage Notes (Signed)
Pt c/o nasal congestion, cough and wheeze for several weeks. Pt is concerned he may have bronchitis, pt does have history of same. Pt denies f/c/n/v/d or other symptoms.

## 2021-06-12 DIAGNOSIS — Z299 Encounter for prophylactic measures, unspecified: Secondary | ICD-10-CM | POA: Diagnosis not present

## 2021-06-12 DIAGNOSIS — R5383 Other fatigue: Secondary | ICD-10-CM | POA: Diagnosis not present

## 2021-06-12 DIAGNOSIS — J40 Bronchitis, not specified as acute or chronic: Secondary | ICD-10-CM | POA: Diagnosis not present

## 2021-06-12 DIAGNOSIS — Z6823 Body mass index (BMI) 23.0-23.9, adult: Secondary | ICD-10-CM | POA: Diagnosis not present

## 2021-06-12 DIAGNOSIS — Z713 Dietary counseling and surveillance: Secondary | ICD-10-CM | POA: Diagnosis not present

## 2021-06-13 ENCOUNTER — Other Ambulatory Visit: Payer: Self-pay

## 2021-06-13 ENCOUNTER — Ambulatory Visit
Admission: RE | Admit: 2021-06-13 | Discharge: 2021-06-13 | Disposition: A | Discharge status: Home / Self Care | Payer: 59 | Source: Ambulatory Visit | Attending: Emergency Medicine | Admitting: Emergency Medicine

## 2021-06-13 ENCOUNTER — Ambulatory Visit (INDEPENDENT_AMBULATORY_CARE_PROVIDER_SITE_OTHER): Payer: 59

## 2021-06-13 VITALS — BP 147/85 | HR 100 | Temp 98.4°F | Resp 16 | Ht 68.0 in | Wt 155.0 lb

## 2021-06-13 DIAGNOSIS — R059 Cough, unspecified: Secondary | ICD-10-CM | POA: Diagnosis not present

## 2021-06-13 NOTE — Discharge Instructions (Addendum)
Use the albuterol inhaler as directed.    Follow up with your primary care provider if your symptoms are not improving.     

## 2021-06-13 NOTE — ED Triage Notes (Signed)
Pt reports having a productive cough "for awhile". Sts he was seen here 2 weeks ago for the same and prescribed abx. Sts he is feeling better but can not get rid of the cough and wheezing.
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
## 2021-09-28 DIAGNOSIS — Z0000 Encounter for general adult medical examination without abnormal findings: Secondary | ICD-10-CM | POA: Diagnosis not present

## 2021-09-28 DIAGNOSIS — Z79899 Other long term (current) drug therapy: Secondary | ICD-10-CM | POA: Diagnosis not present

## 2021-09-28 DIAGNOSIS — Z6823 Body mass index (BMI) 23.0-23.9, adult: Secondary | ICD-10-CM | POA: Diagnosis not present

## 2021-09-28 DIAGNOSIS — Z1331 Encounter for screening for depression: Secondary | ICD-10-CM | POA: Diagnosis not present

## 2021-09-28 DIAGNOSIS — R5383 Other fatigue: Secondary | ICD-10-CM | POA: Diagnosis not present

## 2021-09-28 DIAGNOSIS — Z299 Encounter for prophylactic measures, unspecified: Secondary | ICD-10-CM | POA: Diagnosis not present

## 2021-09-28 DIAGNOSIS — Z789 Other specified health status: Secondary | ICD-10-CM | POA: Diagnosis not present

## 2021-09-28 DIAGNOSIS — Z1211 Encounter for screening for malignant neoplasm of colon: Secondary | ICD-10-CM | POA: Diagnosis not present

## 2021-09-29 ENCOUNTER — Other Ambulatory Visit
Admission: RE | Admit: 2021-09-29 | Discharge: 2021-09-29 | Disposition: A | Discharge status: Home / Self Care | Payer: 59 | Attending: Internal Medicine | Admitting: Internal Medicine

## 2021-09-29 DIAGNOSIS — Z125 Encounter for screening for malignant neoplasm of prostate: Secondary | ICD-10-CM | POA: Insufficient documentation

## 2021-09-29 LAB — COMPREHENSIVE METABOLIC PANEL
ALT: 32 U/L (ref 0–44)
AST: 28 U/L (ref 15–41)
Albumin: 4.4 g/dL (ref 3.5–5.0)
Alkaline Phosphatase: 65 U/L (ref 38–126)
Anion gap: 4 — ABNORMAL LOW (ref 5–15)
BUN: 12 mg/dL (ref 6–20)
CO2: 27 mmol/L (ref 22–32)
Calcium: 9.1 mg/dL (ref 8.9–10.3)
Chloride: 106 mmol/L (ref 98–111)
Creatinine, Ser: 0.96 mg/dL (ref 0.61–1.24)
GFR, Estimated: >60 mL/min (ref >60)
Glucose, Bld: 104 mg/dL — ABNORMAL HIGH (ref 70–99)
Potassium: 4.3 mmol/L (ref 3.5–5.1)
Sodium: 137 mmol/L (ref 135–145)
Total Bilirubin: 1 mg/dL (ref 0.3–1.2)
Total Protein: 7.3 g/dL (ref 6.5–8.1)

## 2021-09-29 LAB — CBC
HCT: 44.2 % (ref 39.0–52.0)
Hemoglobin: 15.2 g/dL (ref 13.0–17.0)
MCH: 30.4 pg (ref 26.0–34.0)
MCHC: 34.4 g/dL (ref 30.0–36.0)
MCV: 88.4 fL (ref 80.0–100.0)
Platelets: 303 10*3/uL (ref 150–400)
RBC: 5 MIL/uL (ref 4.22–5.81)
RDW: 12.6 % (ref 11.5–15.5)
WBC: 10.8 10*3/uL — ABNORMAL HIGH (ref 4.0–10.5)
nRBC: 0 % (ref 0.0–0.2)

## 2021-09-29 LAB — LIPID PANEL
Cholesterol: 180 mg/dL (ref 0–200)
HDL: 47 mg/dL (ref >40)
LDL Cholesterol: 119 mg/dL — ABNORMAL HIGH (ref 0–99)
Total CHOL/HDL Ratio: 3.8 RATIO
Triglycerides: 69 mg/dL (ref <150)
VLDL: 14 mg/dL (ref 0–40)

## 2021-09-29 LAB — TSH: TSH: 1.295 u[IU]/mL (ref 0.350–4.500)

## 2021-09-30 LAB — PSA, TOTAL AND FREE
PSA, Free Pct: 12 %
PSA, Free: 0.42 ng/mL
Prostate Specific Ag, Serum: 3.5 ng/mL (ref 0.0–4.0)

## 2021-10-17 ENCOUNTER — Ambulatory Visit
Admission: EM | Admit: 2021-10-17 | Discharge: 2021-10-17 | Disposition: A | Discharge status: Home / Self Care | Payer: 59 | Attending: Emergency Medicine | Admitting: Emergency Medicine

## 2021-10-17 ENCOUNTER — Other Ambulatory Visit: Payer: Self-pay

## 2021-10-17 DIAGNOSIS — H1031 Unspecified acute conjunctivitis, right eye: Secondary | ICD-10-CM

## 2021-10-17 MED ORDER — MOXIFLOXACIN HCL 0.5 % OP SOLN: 1.0000 [drp] | Freq: Three times a day (TID) | OPHTHALMIC | 0 refills | Status: AC

## 2021-10-17 NOTE — Discharge Instructions (Signed)
Instill 1 drop of Vigamox in each eye every 8 hours for the next 7 days for treatment of your conjunctivitis.  Avoid touching your eyes as much as possible.  Wipe down all surfaces, countertops, and doorknobs after the first and second 24 hours on eyedrops.  Wash her face with a clean wash rag to remove any drainage and use a different portion of the wash rag to clean each eye so as to not reinfect yourself.  Return for reevaluation for any new or worsening symptoms.  

## 2021-10-17 NOTE — ED Triage Notes (Signed)
Pt here with C/O itching eye since last night, woke up with glued shut, DR in ICU told him to get treated for [facility]
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
## 2022-02-04 ENCOUNTER — Other Ambulatory Visit: Payer: Self-pay

## 2022-02-04 ENCOUNTER — Emergency Department
Admission: EM | Admit: 2022-02-04 | Discharge: 2022-02-04 | Disposition: A | Discharge status: Home / Self Care | Payer: PRIVATE HEALTH INSURANCE | Attending: Emergency Medicine | Admitting: Emergency Medicine

## 2022-02-04 DIAGNOSIS — Y990 Civilian activity done for income or pay: Secondary | ICD-10-CM | POA: Insufficient documentation

## 2022-02-04 DIAGNOSIS — T754XXA Electrocution, initial encounter: Secondary | ICD-10-CM | POA: Diagnosis not present

## 2022-02-04 DIAGNOSIS — W861XXA Exposure to industrial wiring, appliances and electrical machinery, initial encounter: Secondary | ICD-10-CM | POA: Diagnosis not present

## 2022-02-04 NOTE — ED Notes (Signed)
Pt states he was unplugging his lab cart when he felt numbness and tingling on his right hand and arm. Pt states he let go of the cord and but the tingling and numbness continues.  ?
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
## 2022-02-04 NOTE — Discharge Instructions (Signed)
Follow-up with Workmen's Comp. as directed.  Your EKG was normal.  Return to the emergency department today if any sudden changes or urgent concerns. ?
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
## 2022-09-30 DIAGNOSIS — Z6826 Body mass index (BMI) 26.0-26.9, adult: Secondary | ICD-10-CM | POA: Diagnosis not present

## 2022-09-30 DIAGNOSIS — Z299 Encounter for prophylactic measures, unspecified: Secondary | ICD-10-CM | POA: Diagnosis not present

## 2022-09-30 DIAGNOSIS — R5383 Other fatigue: Secondary | ICD-10-CM | POA: Diagnosis not present

## 2022-09-30 DIAGNOSIS — Z79899 Other long term (current) drug therapy: Secondary | ICD-10-CM | POA: Diagnosis not present

## 2022-09-30 DIAGNOSIS — Z0000 Encounter for general adult medical examination without abnormal findings: Secondary | ICD-10-CM | POA: Diagnosis not present

## 2022-09-30 DIAGNOSIS — Z1331 Encounter for screening for depression: Secondary | ICD-10-CM | POA: Diagnosis not present

## 2022-10-04 ENCOUNTER — Other Ambulatory Visit
Admission: RE | Admit: 2022-10-04 | Discharge: 2022-10-04 | Disposition: A | Discharge status: Home / Self Care | Payer: 59 | Attending: Internal Medicine | Admitting: Internal Medicine

## 2022-10-04 DIAGNOSIS — Z125 Encounter for screening for malignant neoplasm of prostate: Secondary | ICD-10-CM | POA: Insufficient documentation

## 2022-10-04 DIAGNOSIS — Z0001 Encounter for general adult medical examination with abnormal findings: Secondary | ICD-10-CM | POA: Insufficient documentation

## 2022-10-04 DIAGNOSIS — R5383 Other fatigue: Secondary | ICD-10-CM | POA: Insufficient documentation

## 2022-10-04 LAB — COMPREHENSIVE METABOLIC PANEL
ALT: 39 U/L (ref 0–44)
AST: 34 U/L (ref 15–41)
Albumin: 4.2 g/dL (ref 3.5–5.0)
Alkaline Phosphatase: 57 U/L (ref 38–126)
Anion gap: 7 (ref 5–15)
BUN: 13 mg/dL (ref 6–20)
CO2: 26 mmol/L (ref 22–32)
Calcium: 9.2 mg/dL (ref 8.9–10.3)
Chloride: 106 mmol/L (ref 98–111)
Creatinine, Ser: 1.03 mg/dL (ref 0.61–1.24)
GFR, Estimated: >60 mL/min (ref >60)
Glucose, Bld: 93 mg/dL (ref 70–99)
Potassium: 4.2 mmol/L (ref 3.5–5.1)
Sodium: 139 mmol/L (ref 135–145)
Total Bilirubin: 0.8 mg/dL (ref 0.3–1.2)
Total Protein: 7.2 g/dL (ref 6.5–8.1)

## 2022-10-04 LAB — PSA: Prostatic Specific Antigen: 3.57 ng/mL (ref 0.00–4.00)

## 2022-10-04 LAB — LIPID PANEL
Cholesterol: 214 mg/dL — ABNORMAL HIGH (ref 0–200)
HDL: 46 mg/dL (ref >40)
LDL Cholesterol: 150 mg/dL — ABNORMAL HIGH (ref 0–99)
Total CHOL/HDL Ratio: 4.7 RATIO
Triglycerides: 90 mg/dL (ref <150)
VLDL: 18 mg/dL (ref 0–40)

## 2022-10-04 LAB — CBC
HCT: 44.4 % (ref 39.0–52.0)
Hemoglobin: 14.8 g/dL (ref 13.0–17.0)
MCH: 29.9 pg (ref 26.0–34.0)
MCHC: 33.3 g/dL (ref 30.0–36.0)
MCV: 89.7 fL (ref 80.0–100.0)
Platelets: 282 10*3/uL (ref 150–400)
RBC: 4.95 MIL/uL (ref 4.22–5.81)
RDW: 12.7 % (ref 11.5–15.5)
WBC: 7.9 10*3/uL (ref 4.0–10.5)
nRBC: 0 % (ref 0.0–0.2)

## 2022-10-04 LAB — TSH: TSH: 1.14 u[IU]/mL (ref 0.350–4.500)

## 2022-10-15 DIAGNOSIS — Z713 Dietary counseling and surveillance: Secondary | ICD-10-CM | POA: Diagnosis not present

## 2022-10-15 DIAGNOSIS — Z6826 Body mass index (BMI) 26.0-26.9, adult: Secondary | ICD-10-CM | POA: Diagnosis not present

## 2022-10-15 DIAGNOSIS — J029 Acute pharyngitis, unspecified: Secondary | ICD-10-CM | POA: Diagnosis not present

## 2022-10-15 DIAGNOSIS — R5383 Other fatigue: Secondary | ICD-10-CM | POA: Diagnosis not present

## 2023-02-05 IMAGING — CR DG CHEST 2V
2 series · 2 of 2 positions shown · non-contrast
Comparison: [DATE]

CLINICAL DATA: Cough, productive.

EXAM:
CHEST - 2 VIEW

[chest pa]
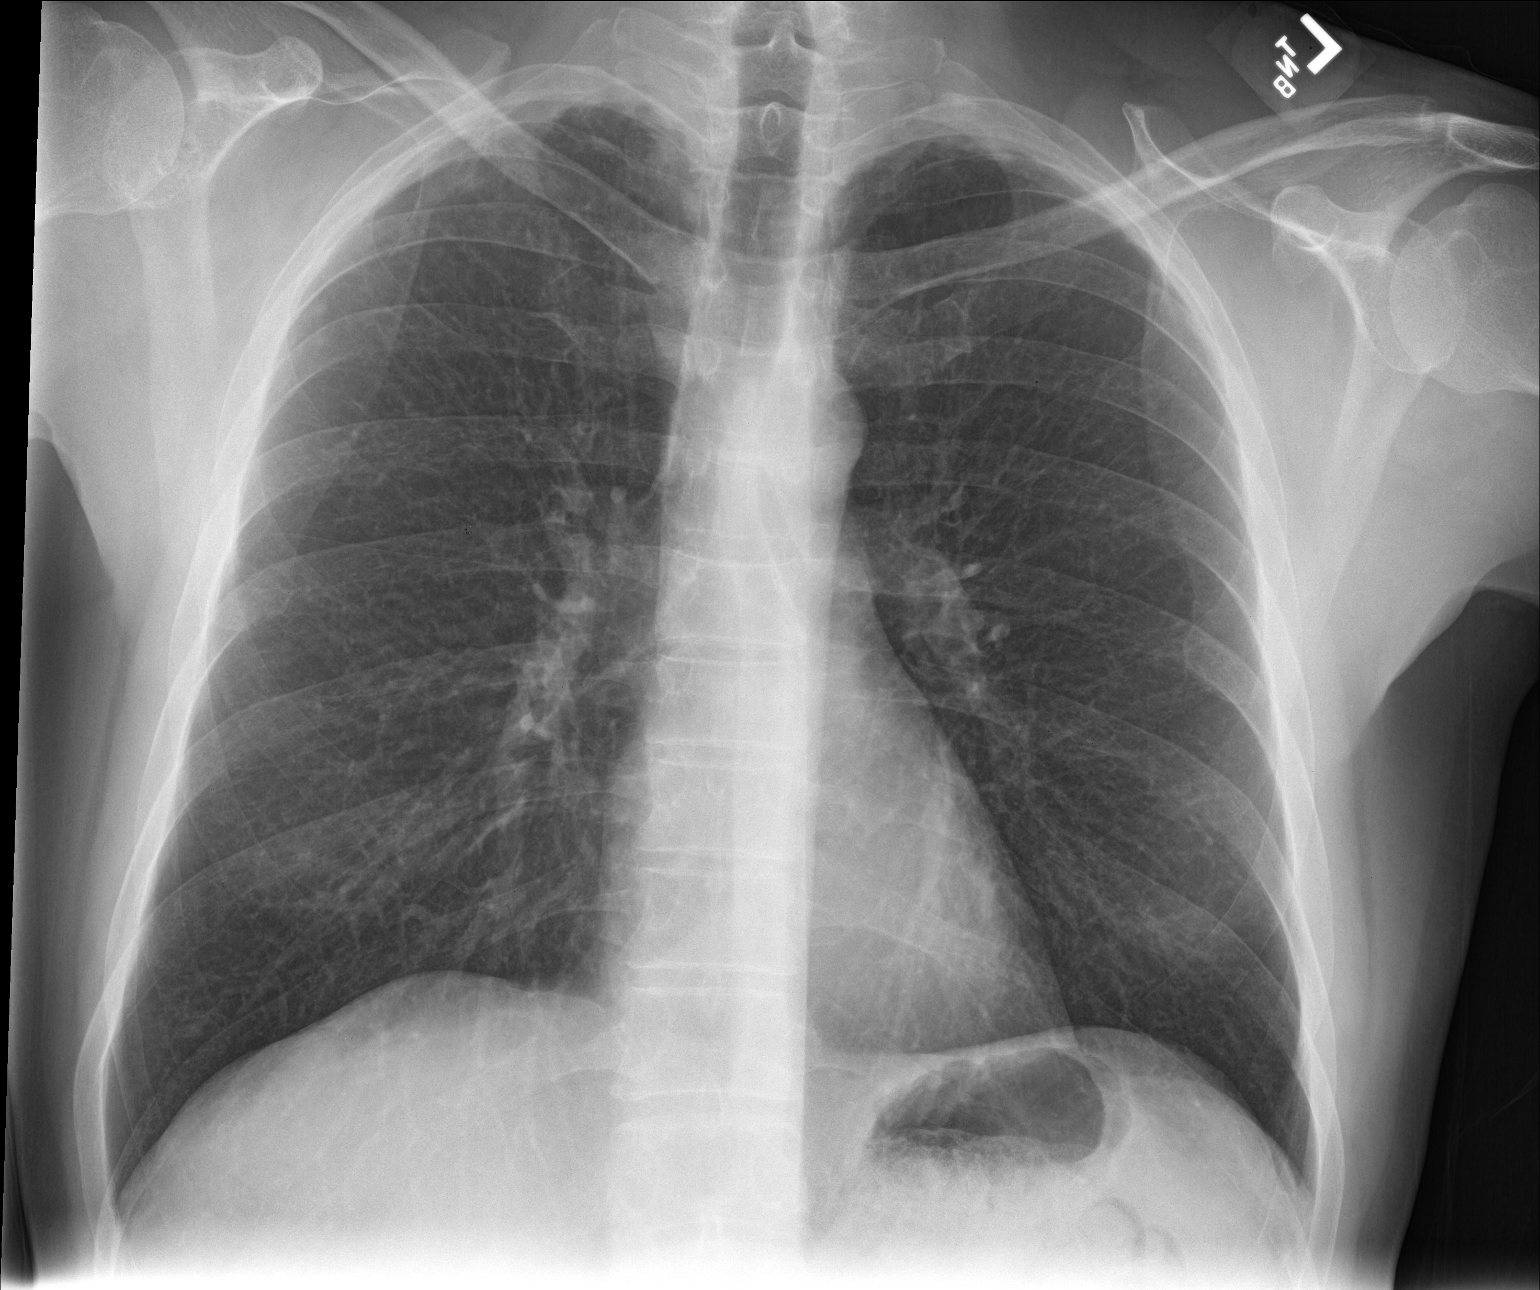

[chest lat]
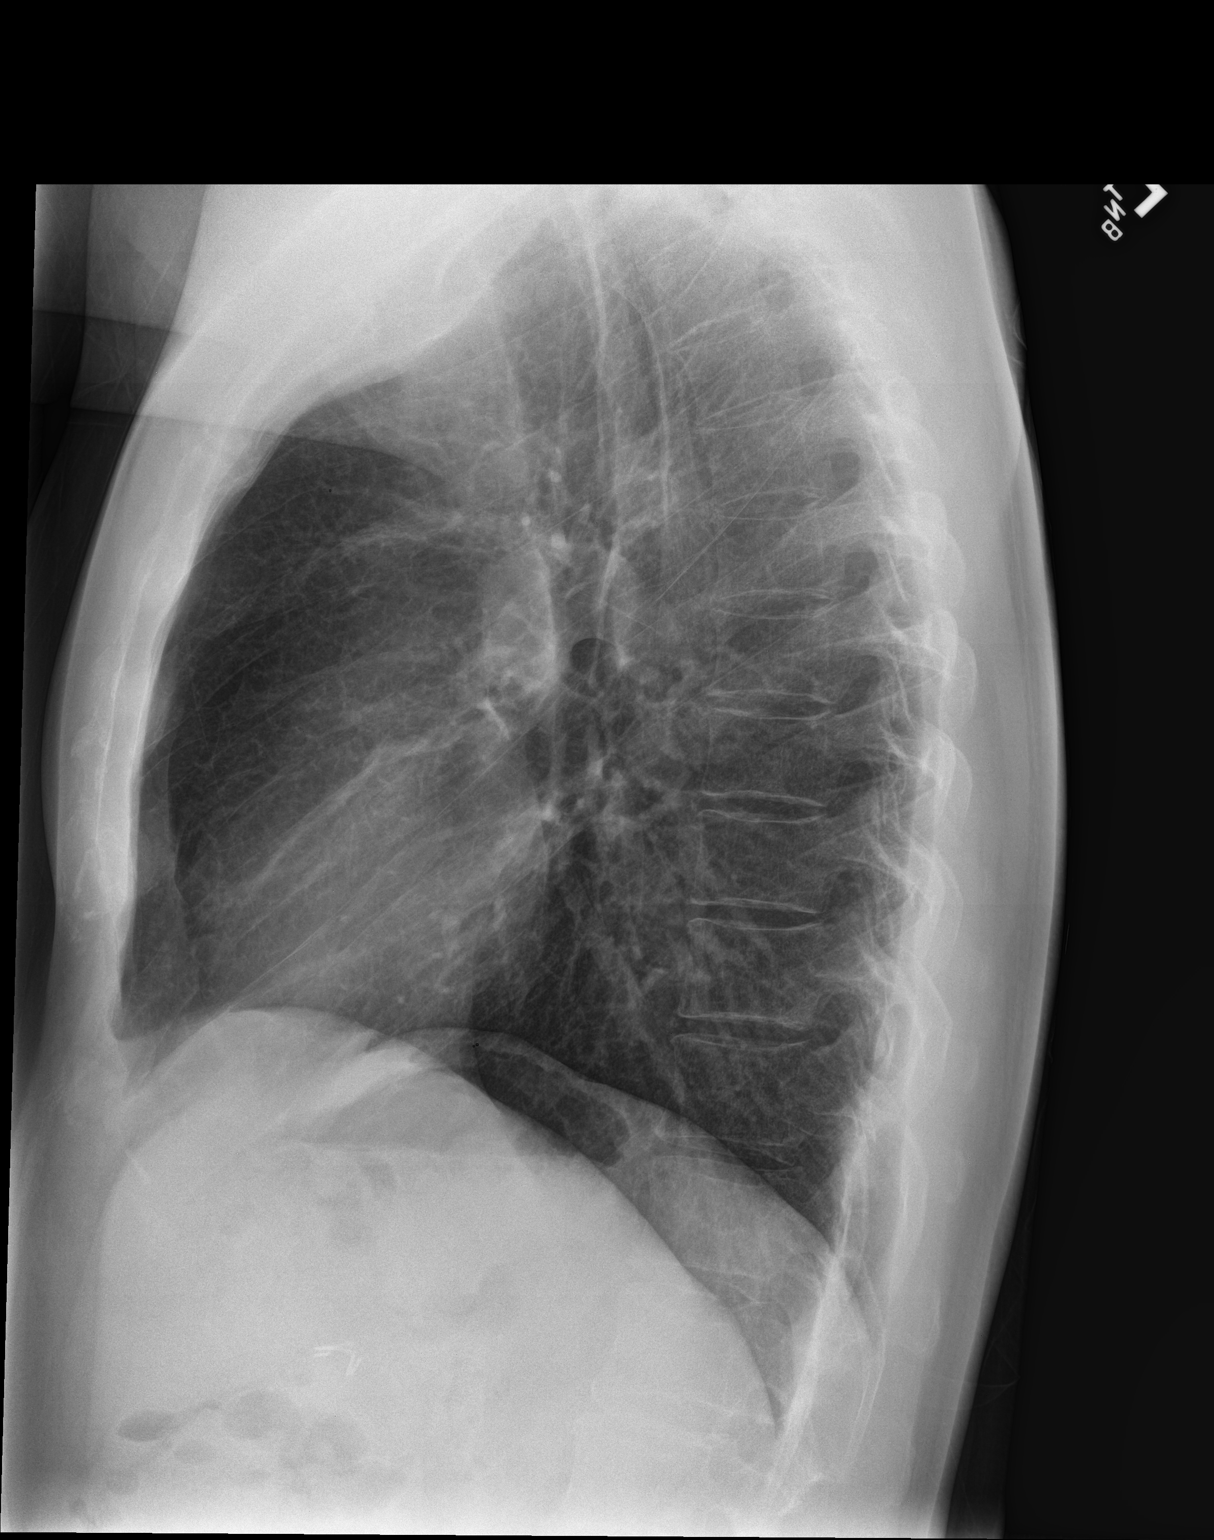

[2 of 2 positions shown; findings below may reference images not displayed]

FINDINGS: The heart size and mediastinal contours are within normal limits.
Both lungs are clear. The visualized skeletal structures are
unremarkable.
IMPRESSION: No active cardiopulmonary disease.

## 2023-11-09 DIAGNOSIS — Z1331 Encounter for screening for depression: Secondary | ICD-10-CM | POA: Diagnosis not present

## 2023-11-09 DIAGNOSIS — R5383 Other fatigue: Secondary | ICD-10-CM | POA: Diagnosis not present

## 2023-11-09 DIAGNOSIS — Z0000 Encounter for general adult medical examination without abnormal findings: Secondary | ICD-10-CM | POA: Diagnosis not present

## 2023-11-09 DIAGNOSIS — E7800 Pure hypercholesterolemia, unspecified: Secondary | ICD-10-CM | POA: Diagnosis not present

## 2023-11-09 DIAGNOSIS — R059 Cough, unspecified: Secondary | ICD-10-CM | POA: Diagnosis not present

## 2023-11-09 DIAGNOSIS — Z299 Encounter for prophylactic measures, unspecified: Secondary | ICD-10-CM | POA: Diagnosis not present

## 2023-11-10 ENCOUNTER — Other Ambulatory Visit: Admit: 2023-11-10 | Payer: Commercial Managed Care - PPO

## 2023-11-11 ENCOUNTER — Other Ambulatory Visit
Admission: RE | Admit: 2023-11-11 | Discharge: 2023-11-11 | Disposition: A | Discharge status: Home / Self Care | Payer: Commercial Managed Care - PPO | Attending: Internal Medicine | Admitting: Internal Medicine

## 2023-11-11 DIAGNOSIS — Z0000 Encounter for general adult medical examination without abnormal findings: Secondary | ICD-10-CM | POA: Insufficient documentation

## 2023-11-11 LAB — CBC
HCT: 43.5 % (ref 39.0–52.0)
Hemoglobin: 14.8 g/dL (ref 13.0–17.0)
MCH: 30.6 pg (ref 26.0–34.0)
MCHC: 34 g/dL (ref 30.0–36.0)
MCV: 90.1 fL (ref 80.0–100.0)
Platelets: 312 10*3/uL (ref 150–400)
RBC: 4.83 MIL/uL (ref 4.22–5.81)
RDW: 12.5 % (ref 11.5–15.5)
WBC: 12.7 10*3/uL — ABNORMAL HIGH (ref 4.0–10.5)
nRBC: 0 % (ref 0.0–0.2)

## 2023-11-11 LAB — LIPID PANEL
Cholesterol: 180 mg/dL (ref 0–200)
HDL: 31 mg/dL — ABNORMAL LOW (ref >40)
LDL Cholesterol: 120 mg/dL — ABNORMAL HIGH (ref 0–99)
Total CHOL/HDL Ratio: 5.8 {ratio}
Triglycerides: 143 mg/dL (ref <150)
VLDL: 29 mg/dL (ref 0–40)

## 2023-11-11 LAB — COMPREHENSIVE METABOLIC PANEL
ALT: 42 U/L (ref 0–44)
AST: 35 U/L (ref 15–41)
Albumin: 3.9 g/dL (ref 3.5–5.0)
Alkaline Phosphatase: 75 U/L (ref 38–126)
Anion gap: 10 (ref 5–15)
BUN: 15 mg/dL (ref 6–20)
CO2: 26 mmol/L (ref 22–32)
Calcium: 9.2 mg/dL (ref 8.9–10.3)
Chloride: 103 mmol/L (ref 98–111)
Creatinine, Ser: 0.97 mg/dL (ref 0.61–1.24)
GFR, Estimated: >60 mL/min (ref >60)
Glucose, Bld: 90 mg/dL (ref 70–99)
Potassium: 3.9 mmol/L (ref 3.5–5.1)
Sodium: 139 mmol/L (ref 135–145)
Total Bilirubin: 0.6 mg/dL (ref 0.0–1.2)
Total Protein: 7 g/dL (ref 6.5–8.1)

## 2023-11-11 LAB — TSH: TSH: 1.993 u[IU]/mL (ref 0.350–4.500)

## 2024-07-09 DIAGNOSIS — E7800 Pure hypercholesterolemia, unspecified: Secondary | ICD-10-CM | POA: Diagnosis not present

## 2024-07-09 DIAGNOSIS — J069 Acute upper respiratory infection, unspecified: Secondary | ICD-10-CM | POA: Diagnosis not present

## 2024-07-15 ENCOUNTER — Ambulatory Visit: Payer: Self-pay | Admitting: Physician Assistant

## 2024-07-15 ENCOUNTER — Encounter: Payer: Self-pay | Admitting: Emergency Medicine

## 2024-07-15 ENCOUNTER — Ambulatory Visit (INDEPENDENT_AMBULATORY_CARE_PROVIDER_SITE_OTHER)

## 2024-07-15 ENCOUNTER — Ambulatory Visit
Admission: EM | Admit: 2024-07-15 | Discharge: 2024-07-15 | Disposition: A | Discharge status: Home / Self Care | Attending: Physician Assistant | Admitting: Physician Assistant

## 2024-07-15 DIAGNOSIS — R051 Acute cough: Secondary | ICD-10-CM | POA: Diagnosis not present

## 2024-07-15 DIAGNOSIS — J069 Acute upper respiratory infection, unspecified: Secondary | ICD-10-CM

## 2024-07-15 DIAGNOSIS — R059 Cough, unspecified: Secondary | ICD-10-CM | POA: Diagnosis not present

## 2024-07-15 MED ORDER — PROMETHAZINE-DM 6.25-15 MG/5ML PO SYRP: 5.0000 mL | ORAL_SOLUTION | Freq: Four times a day (QID) | ORAL | 0 refills | Status: AC | PRN

## 2024-07-15 MED ORDER — PREDNISONE 20 MG PO TABS: 40.0000 mg | ORAL_TABLET | Freq: Every day | ORAL | 0 refills | Status: AC
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
## 2024-07-15 NOTE — ED Triage Notes (Signed)
Patient c/o cough and chest congestion for over a week.  Patient denies fevers.  

## 2024-07-15 NOTE — Discharge Instructions (Addendum)
-  I did not see any pneumonia or sign of infection in your lungs on your x-ray but I will contact you with radiologist disagrees and see something acutely abnormal. - Your symptoms are consistent with a viral illness.  I sent a couple days of prednisone  and cough medication.  Symptoms could last another week.  If you develop a fever or have increased breathing problem please return or see PCP.  URI/COLD SYMPTOMS: Your exam today is consistent with a viral illness. Antibiotics are not indicated at this time. Use medications as directed, including cough syrup, nasal saline, and decongestants. Your symptoms should improve over the next few days and resolve within the next couple of weeks. Increase rest and fluids. F/u if symptoms worsen or predominate such as sore throat, ear pain, productive cough, shortness of breath, or if you develop high fevers or worsening fatigue over the next several days.

## 2024-11-16 ENCOUNTER — Other Ambulatory Visit
Admission: RE | Admit: 2024-11-16 | Discharge: 2024-11-16 | Disposition: A | Source: Ambulatory Visit | Attending: Internal Medicine | Admitting: Internal Medicine

## 2024-11-16 DIAGNOSIS — Z0000 Encounter for general adult medical examination without abnormal findings: Secondary | ICD-10-CM | POA: Insufficient documentation

## 2024-11-16 LAB — COMPREHENSIVE METABOLIC PANEL WITH GFR
ALT: 33 U/L (ref 0–44)
AST: 29 U/L (ref 15–41)
Albumin: 4.1 g/dL (ref 3.5–5.0)
Alkaline Phosphatase: 87 U/L (ref 38–126)
Anion gap: 10 (ref 5–15)
BUN: 12 mg/dL (ref 6–20)
CO2: 23 mmol/L (ref 22–32)
Calcium: 9.3 mg/dL (ref 8.9–10.3)
Chloride: 102 mmol/L (ref 98–111)
Creatinine, Ser: 1.08 mg/dL (ref 0.61–1.24)
GFR, Estimated: >60 mL/min
Glucose, Bld: 198 mg/dL — ABNORMAL HIGH (ref 70–99)
Potassium: 4.1 mmol/L (ref 3.5–5.1)
Sodium: 135 mmol/L (ref 135–145)
Total Bilirubin: 0.5 mg/dL (ref 0.0–1.2)
Total Protein: 6.9 g/dL (ref 6.5–8.1)

## 2024-11-16 LAB — TSH: TSH: 1.3 u[IU]/mL (ref 0.350–4.500)

## 2024-11-16 LAB — LIPID PANEL
Cholesterol: 183 mg/dL (ref 0–200)
HDL: 43 mg/dL
LDL Cholesterol: 121 mg/dL — ABNORMAL HIGH (ref 0–99)
Total CHOL/HDL Ratio: 4.3 ratio
Triglycerides: 96 mg/dL
VLDL: 19 mg/dL (ref 0–40)

## 2024-11-16 LAB — CBC
HCT: 43.6 % (ref 39.0–52.0)
Hemoglobin: 14.5 g/dL (ref 13.0–17.0)
MCH: 29.5 pg (ref 26.0–34.0)
MCHC: 33.3 g/dL (ref 30.0–36.0)
MCV: 88.8 fL (ref 80.0–100.0)
Platelets: 287 10*3/uL (ref 150–400)
RBC: 4.91 MIL/uL (ref 4.22–5.81)
RDW: 12.6 % (ref 11.5–15.5)
WBC: 7.7 10*3/uL (ref 4.0–10.5)
nRBC: 0 % (ref 0.0–0.2)

## 2024-11-16 LAB — PSA: Prostatic Specific Antigen: 9.49 ng/mL — ABNORMAL HIGH (ref 0.00–4.00)

## 2024-12-12 ENCOUNTER — Ambulatory Visit: Admitting: Urology
